# Patient Record
Sex: Male | Born: 1995 | Race: White | Hispanic: No | Smoking: Never smoker
Health system: Southern US, Community
[De-identification: ages and names within clinical notes are randomized; demographics above are authoritative.]

## PROBLEM LIST (undated history)

## (undated) DIAGNOSIS — F988 Other specified behavioral and emotional disorders with onset usually occurring in childhood and adolescence: Secondary | ICD-10-CM

## (undated) DIAGNOSIS — E669 Obesity, unspecified: Secondary | ICD-10-CM

## (undated) HISTORY — PX: NASAL POLYP SURGERY: SHX186

---

## 2021-03-28 ENCOUNTER — Ambulatory Visit
Admission: EM | Admit: 2021-03-28 | Discharge: 2021-03-28 | Disposition: A | Discharge status: Home / Self Care | Payer: BLUE CROSS/BLUE SHIELD | Attending: Emergency Medicine | Admitting: Emergency Medicine

## 2021-03-28 ENCOUNTER — Other Ambulatory Visit: Payer: Self-pay

## 2021-03-28 ENCOUNTER — Ambulatory Visit (INDEPENDENT_AMBULATORY_CARE_PROVIDER_SITE_OTHER): Payer: BLUE CROSS/BLUE SHIELD

## 2021-03-28 DIAGNOSIS — R079 Chest pain, unspecified: Secondary | ICD-10-CM

## 2021-03-28 DIAGNOSIS — R0789 Other chest pain: Secondary | ICD-10-CM | POA: Diagnosis present

## 2021-03-28 HISTORY — DX: Obesity, unspecified: E66.9

## 2021-03-28 HISTORY — DX: Other specified behavioral and emotional disorders with onset usually occurring in childhood and adolescence: F98.8

## 2021-03-28 LAB — CBC WITH DIFFERENTIAL/PLATELET
Abs Immature Granulocytes: 0.07 10*3/uL (ref 0.00–0.07)
Basophils Absolute: 0.1 10*3/uL (ref 0.0–0.1)
Basophils Relative: 1 %
Eosinophils Absolute: 0.2 10*3/uL (ref 0.0–0.5)
Eosinophils Relative: 2 %
HCT: 46.1 % (ref 39.0–52.0)
Hemoglobin: 15.3 g/dL (ref 13.0–17.0)
Immature Granulocytes: 1 %
Lymphocytes Relative: 22 %
Lymphs Abs: 2.3 10*3/uL (ref 0.7–4.0)
MCH: 27.7 pg (ref 26.0–34.0)
MCHC: 33.2 g/dL (ref 30.0–36.0)
MCV: 83.5 fL (ref 80.0–100.0)
Monocytes Absolute: 0.7 10*3/uL (ref 0.1–1.0)
Monocytes Relative: 7 %
Neutro Abs: 7.3 10*3/uL (ref 1.7–7.7)
Neutrophils Relative %: 67 %
Platelets: 318 10*3/uL (ref 150–400)
RBC: 5.52 MIL/uL (ref 4.22–5.81)
RDW: 13.9 % (ref 11.5–15.5)
WBC: 10.7 10*3/uL — ABNORMAL HIGH (ref 4.0–10.5)
nRBC: 0 % (ref 0.0–0.2)

## 2021-03-28 LAB — BASIC METABOLIC PANEL
Anion gap: 12 (ref 5–15)
BUN: 14 mg/dL (ref 6–20)
CO2: 24 mmol/L (ref 22–32)
Calcium: 9.4 mg/dL (ref 8.9–10.3)
Chloride: 98 mmol/L (ref 98–111)
Creatinine, Ser: 1.04 mg/dL (ref 0.61–1.24)
GFR, Estimated: >60 mL/min (ref >60)
Glucose, Bld: 95 mg/dL (ref 70–99)
Potassium: 3.8 mmol/L (ref 3.5–5.1)
Sodium: 134 mmol/L — ABNORMAL LOW (ref 135–145)

## 2021-03-28 LAB — TROPONIN I (HIGH SENSITIVITY): Troponin I (High Sensitivity): 6 ng/L (ref <18)

## 2021-03-28 MED ORDER — IBUPROFEN 600 MG PO TABS: 600.0000 mg | ORAL_TABLET | Freq: Four times a day (QID) | ORAL | 0 refills | Status: AC | PRN

## 2021-03-28 MED ORDER — IBUPROFEN 400 MG PO TABS
400.0000 mg | ORAL_TABLET | Freq: Once | ORAL | Status: AC
  Administered 2021-03-28: 400 mg via ORAL

## 2021-03-28 NOTE — ED Provider Notes (Signed)
[facility]    CSN: [id number] Arrival date & time: 03/28/21  0802      History   Chief Complaint No chief complaint on file.   HPI Jeffery Snow is a 26 y.o. male.   HPI  26 year old male here for evaluation of chest pain.  Patient reports that his chest pain started around 5 AM this morning.  He was awake at the time and it did not wake him from sleep.  It is located in the left side of his chest and does not radiate.  It is not associate with any shortness of breath, sweating, nausea, cough, dizziness, or syncope.  The pain does not increase with movement.  He is not a smoker does not have any previous history of pain.  He states that on exam he is also experienced some some pain in his left back along his scapular spine.  This is not present unless palpation is occurring.  The pain does not increase with movement.  He has had an increase in his physical activity over the past 2 weeks in the form of walking.  He does occasionally have heart pounding when he is exerting himself but not at rest.  He did take 1 single over-the-counter ibuprofen tablet at 0 700 this morning and it has not helped his pain.  He describes the pain more as a pulsing and it is worse when he lays down with him rating 4-5/10, improves with sitting and he rates it 2-3/10, and improves more with standing with a report of 1-2/10.  Patient has a history of ADHD and takes Adderall.  His only other history is nasal polyp surgery which occurred at a very young age.  He does not currently have a primary care provider.  Past Medical History:  Diagnosis Date   ADD (attention deficit disorder)    Obesity     There are no problems to display for this patient.   Past Surgical History:  Procedure Laterality Date   NASAL POLYP SURGERY         Home Medications    Prior to Admission medications   Medication Sig Start Date End Date Taking? Authorizing Provider  ibuprofen (ADVIL) 600 MG tablet Take 1  tablet (600 mg total) by mouth every 6 (six) hours as needed. 03/28/21  Yes Becky Augustayan, Jamilette Suchocki, NP  amphetamine-dextroamphetamine (ADDERALL) 15 MG tablet Take 1 tablet by mouth 2 (two) times daily. 03/15/21   [provider]    Family History History reviewed. No pertinent family history.  Social History Social History   Tobacco Use   Smoking status: Never    Passive exposure: Never   Smokeless tobacco: Never  Vaping Use   Vaping Use: Never used  Substance Use Topics   Alcohol use: Yes    Comment: occasional     Allergies   Cephalosporins and Penicillins   Review of Systems Review of Systems  Constitutional:  Negative for diaphoresis and fatigue.  Respiratory:  Negative for cough and shortness of breath.   Cardiovascular:  Positive for chest pain. Negative for palpitations and leg swelling.  Gastrointestinal:  Negative for nausea.  Musculoskeletal:  Positive for myalgias.  Skin:  Negative for rash.  Neurological:  Negative for dizziness, syncope and weakness.  Hematological: Negative.   Psychiatric/Behavioral: Negative.      Physical Exam Triage Vital Signs ED Triage Vitals [03/28/21 0817]  Enc Vitals Group     BP      Pulse  Resp      Temp      Temp src      SpO2      Weight (!) 370 lb (167.8 kg)     Height 6\' 3"  (1.905 m)     Head Circumference      Peak Flow      Pain Score 0     Pain Loc      Pain Edu?      Excl. in [place]?    No data found.  Updated Vital Signs BP 132/85 (BP Location: Left Arm)    Pulse 88    Temp 98.4 F (36.9 C) (Oral)    Resp 19    Ht 6\' 3"  (1.905 m)    Wt (!) 370 lb (167.8 kg)    SpO2 100%    BMI 46.25 kg/m   Visual Acuity Right Eye Distance:   Left Eye Distance:   Bilateral Distance:    Right Eye Near:   Left Eye Near:    Bilateral Near:     Physical Exam Vitals and nursing note reviewed.  Constitutional:      General: He is not in acute distress.    Appearance: Normal appearance. He is obese. He is not  ill-appearing.  HENT:     Head: Normocephalic and atraumatic.  Eyes:     Pupils: Pupils are equal, round, and reactive to light.  Neck:     Vascular: No carotid bruit.  Cardiovascular:     Rate and Rhythm: Normal rate and regular rhythm.     Pulses: Normal pulses.     Heart sounds: Normal heart sounds. No murmur heard.   No friction rub. No gallop.  Pulmonary:     Effort: Pulmonary effort is normal.     Breath sounds: Normal breath sounds. No wheezing, rhonchi or rales.  Chest:     Chest wall: No tenderness.  Musculoskeletal:     Cervical back: Normal range of motion and neck supple.  Skin:    General: Skin is warm and dry.     Capillary Refill: Capillary refill takes less than 2 seconds.     Findings: No erythema or rash.  Neurological:     General: No focal deficit present.     Mental Status: He is alert and oriented to person, place, and time.  Psychiatric:        Mood and Affect: Mood normal.        Behavior: Behavior normal.        Thought Content: Thought content normal.        Judgment: Judgment normal.     UC Treatments / Results  Labs (all labs ordered are listed, but only abnormal results are displayed) Labs Reviewed  CBC WITH DIFFERENTIAL/PLATELET - Abnormal; Notable for the following components:      Result Value   WBC 10.7 (*)    All other components within normal limits  BASIC METABOLIC PANEL - Abnormal; Notable for the following components:   Sodium 134 (*)    All other components within normal limits  TROPONIN I (HIGH SENSITIVITY)    EKG Sinus rhythm with sinus arrhythmia and a ventricular rate of 81 bpm PR interval 174 ms QRS duration 98 ms QT/QTc 370/429 ms Patient does have questionable flattening of the ST segment in V1 possibly V6.  There is no other abnormalities noted in any of the other leads.  No other EKGs for comparison. Machine interpretation is possible left atrial enlargement, left ventricular  hypertrophy, abnormal  EKG.   Radiology DG Chest 2 View  Result Date: 03/28/2021 CLINICAL DATA:  Chest pain. EXAM: CHEST - 2 VIEW COMPARISON:  None. FINDINGS: Trachea is midline. Heart size normal. Lungs are clear. No pleural fluid. IMPRESSION: No acute findings. Electronically Signed   By: Lorin Picket M.D.   On: 03/28/2021 08:58    Procedures Procedures (including critical care time)  Medications Ordered in UC Medications  ibuprofen (ADVIL) tablet 400 mg (400 mg Oral Given 03/28/21 0856)    Initial Impression / Assessment and Plan / UC Course  I have reviewed the triage vital signs and the nursing notes.  Pertinent labs & imaging results that were available during my care of the patient were reviewed by me and considered in my medical decision making (see chart for details).  Patient is a nontoxic-appearing 26 year old male with past medical history of ADHD on Adderall 15 mg IR daily and nasal polyp surgery at a young age.  No other significant past medical history and patient does not have a PCP.  He is obese and weighs 370 pounds with a BMI of 46.25.  He is mildly hypertensive at 132/85.  He is presenting with a complaint of left-sided chest pain that he describes as a pulsating that is worse when he lays down and improves with sitting and standing.  He has never had this before.  He is not a smoker.  There is no associated cough, shortness of breath, sweating, nausea, dizziness, or syncope.  The pain does not increase with movement or deep breathing.  On physical exam patient is in a relaxed state with a normal axial carriage.  He is able to carry on a conversation without any dyspnea or tachypnea.  On physical exam his heart sounds are S1-S2 without any murmur, rub, or gallop.  Lung sounds are clear to auscultation all fields.  No bruits appreciated when auscultating the carotid arteries on either side.  Etiology of patient's discomfort is unclear.  It is less likely that it is pericarditis even though it  improves with sitting up as the pain is not sharp and pleuritic.  There is no friction rub to auscultation and EKG does not show widespread ST elevation and PR depression.  Will check CBC, BMP, troponin, and chest x-ray.  EKG was collected at triage and does show sinus arrhythmia.  There are no other significant abnormalities noted.  Patient has no other EKG tracings in the system for comparison.  CBC has a very mildly elevated white count of 10.7 but is otherwise unremarkable.  Chest x-ray independently reviewed and evaluated by me.  Impression: Right vasculature is prominent.  There is no effusion or infiltrate noted.  Ideology overread is pending. Radiology impression states trachea is midline, heart size is normal, lungs are clear, no pleural fluid, no acute findings.  BMP shows a mildly decreased sodium of 134 but is otherwise unremarkable.  Troponin is 6.  Given the fact that patient has some tenderness to his back along his left scapular spine with palpation gives credence that this may be musculoskeletal in nature.  I Minna discharge him home with atypical chest pain as his diagnosis and make referral to cardiology given his sinus arrhythmia.  He does endorse that the higher dose of ibuprofen seems to be helping his discomfort some.  I advised him that if his pain continues, or worsens, is associated with sweating, nausea, dizziness, fainting, or radiation to his shoulder or jaw that he needs to  go to the ER for evaluation.     Final Clinical Impressions(s) / UC Diagnoses   Final diagnoses:  Atypical chest pain     Discharge Instructions      As we discussed, your chest pain may be musculoskeletal in nature even though it does not seem to increase with movement.  You do have some tenderness along the left scapular spine to palpation without any overt muscle spasm or tension appreciated on exam.  As you expressed some improvement of your symptoms with an appropriate dose of  ibuprofen I am can have you continue 600 mg of ibuprofen every 6 hours with food as needed for pain.  I am going to refer you to cardiology given that you have this atypical presentation and you have sinus arrhythmia on your EKG.  This is most likely benign and could be a normal variant as we discussed.  If you have continued or worsening chest pain, especially if it is associated with sweating, nausea, shortness of breath, dizziness, fainting, or radiation to your jaw or left shoulder you need to go to the ER for evaluation.     ED Prescriptions     Medication Sig Dispense Auth. Provider   ibuprofen (ADVIL) 600 MG tablet Take 1 tablet (600 mg total) by mouth every 6 (six) hours as needed. 30 tablet Margarette Canada, NP      PDMP not reviewed this encounter.   Margarette Canada, NP 03/28/21 1039

## 2021-03-28 NOTE — Discharge Instructions (Addendum)
As we discussed, your chest pain may be musculoskeletal in nature even though it does not seem to increase with movement.  You do have some tenderness along the left scapular spine to palpation without any overt muscle spasm or tension appreciated on exam.  As you expressed some improvement of your symptoms with an appropriate dose of ibuprofen I am can have you continue 600 mg of ibuprofen every 6 hours with food as needed for pain.  I am going to refer you to cardiology given that you have this atypical presentation and you have sinus arrhythmia on your EKG.  This is most likely benign and could be a normal variant as we discussed.  If you have continued or worsening chest pain, especially if it is associated with sweating, nausea, shortness of breath, dizziness, fainting, or radiation to your jaw or left shoulder you need to go to the ER for evaluation.

## 2021-03-28 NOTE — ED Triage Notes (Addendum)
Pt reports left chest pain starting at 0500.  Worse when lying down, better when sitting, and gone when standing. Denies pain in triage but states "it's just uncomfortable".  Non-radiating and no associated sx. Pt reports hurts to touch over left scapula area

## 2021-06-20 ENCOUNTER — Ambulatory Visit
Admission: RE | Admit: 2021-06-20 | Discharge: 2021-06-20 | Disposition: A | Discharge status: Home / Self Care | Payer: BLUE CROSS/BLUE SHIELD | Source: Ambulatory Visit | Attending: Emergency Medicine | Admitting: Emergency Medicine

## 2021-06-20 VITALS — BP 126/70 | HR 90 | Temp 98.4°F | Resp 18 | Ht 75.0 in | Wt 369.9 lb

## 2021-06-20 DIAGNOSIS — L0231 Cutaneous abscess of buttock: Secondary | ICD-10-CM

## 2021-06-20 MED ORDER — DOXYCYCLINE HYCLATE 100 MG PO CAPS: 100.0000 mg | ORAL_CAPSULE | Freq: Two times a day (BID) | ORAL | 0 refills | Status: DC

## 2021-06-20 NOTE — ED Provider Notes (Signed)
?[place] ? ? ? ?CSN: [id number] ?Arrival date & time: 06/20/21  [id number] ? ? ?  ? ?History   ?Chief Complaint ?Chief Complaint  ?Patient presents with  ? Abscess  ?  Possible abscess on buttock. - Entered by patient  ? 0900 Appt;   ? ? ?HPI ?Jeffery Snow is a 26 y.o. male.  ? ?HPI ? ?26 year old male here for evaluation of skin complaint. ? ?Patient reports that for over a year he has been experiencing mild pain and swelling of his left buttock near his rectal opening.  He states that several months ago the area ruptured and expressed some sanguinous discharge.  Since then he has had mild bleeding and drainage from the area daily.  He states it is not growing in size, he denies any fever, and he denies any pain with bowel movements.  He states that initially he thought it might be hemorrhoid and was treating with Preparation H, which did improve the symptoms, but did not provide resolution. ? ?Past Medical History:  ?Diagnosis Date  ? ADD (attention deficit disorder)   ? Obesity   ? ? ?There are no problems to display for this patient. ? ? ?Past Surgical History:  ?Procedure Laterality Date  ? NASAL POLYP SURGERY    ? ? ? ? ? ?Home Medications   ? ?Prior to Admission medications   ?Medication Sig Start Date End Date Taking? Authorizing Provider  ?doxycycline (VIBRAMYCIN) 100 MG capsule Take 1 capsule (100 mg total) by mouth 2 (two) times daily. 06/20/21  Yes Margarette Canada, NP  ?amphetamine-dextroamphetamine (ADDERALL) 15 MG tablet Take 1 tablet by mouth 2 (two) times daily. 03/15/21   [provider]  ?ibuprofen (ADVIL) 600 MG tablet Take 1 tablet (600 mg total) by mouth every 6 (six) hours as needed. 03/28/21   Margarette Canada, NP  ? ? ?Family History ?History reviewed. No pertinent family history. ? ?Social History ?Social History  ? ?Tobacco Use  ? Smoking status: Never  ?  Passive exposure: Never  ? Smokeless tobacco: Never  ?Vaping Use  ? Vaping Use: Never used  ?Substance Use Topics  ? Alcohol use:  Yes  ?  Comment: occasional  ? ? ? ?Allergies   ?Cephalosporins and Penicillins ? ? ?Review of Systems ?Review of Systems  ?Constitutional:  Negative for fever.  ?Gastrointestinal:  Negative for abdominal pain and rectal pain.  ?Skin:  Positive for wound.  ?Hematological: Negative.   ?Psychiatric/Behavioral: Negative.    ? ? ?Physical Exam ?Triage Vital Signs ?ED Triage Vitals  ?Enc Vitals Group  ?   BP 06/20/21 0917 126/70  ?   Pulse Rate 06/20/21 0917 90  ?   Resp 06/20/21 0917 18  ?   Temp 06/20/21 0917 98.4 ?F (36.9 ?C)  ?   Temp src --   ?   SpO2 06/20/21 0917 99 %  ?   Weight 06/20/21 0916 (!) 369 lb 14.9 oz (167.8 kg)  ?   Height 06/20/21 0916 6\' 3"  (1.905 m)  ?   Head Circumference --   ?   Peak Flow --   ?   Pain Score 06/20/21 0916 0  ?   Pain Loc --   ?   Pain Edu? --   ?   Excl. in [place]? --   ? ?No data found. ? ?Updated Vital Signs ?BP 126/70 (BP Location: Left Arm)   Pulse 90   Temp 98.4 ?F (36.9 ?C)   Resp 18  Ht 6\' 3"  (1.905 m)   Wt (!) 369 lb 14.9 oz (167.8 kg)   SpO2 99%   BMI 46.24 kg/m?  ? ?Visual Acuity ?Right Eye Distance:   ?Left Eye Distance:   ?Bilateral Distance:   ? ?Right Eye Near:   ?Left Eye Near:    ?Bilateral Near:    ? ?Physical Exam ?Vitals and nursing note reviewed.  ?Constitutional:   ?   Appearance: Normal appearance. He is not ill-appearing.  ?Genitourinary: ?   Comments: There is an open area on the inferior aspect of the medial left buttock near the rectal opening that appears to be scabbed over.  There is some mild erythema and induration around the area.  The rectal opening appears normal and there is no evidence of hemorrhoids.  The area is not tender to touch and does not express any discharge with squeezing. ?Skin: ?   General: Skin is warm and dry.  ?   Capillary Refill: Capillary refill takes less than 2 seconds.  ?   Findings: Erythema present.  ?Neurological:  ?   General: No focal deficit present.  ?   Mental Status: He is alert and oriented to person, place,  and time.  ?Psychiatric:     ?   Mood and Affect: Mood normal.     ?   Behavior: Behavior normal.     ?   Thought Content: Thought content normal.     ?   Judgment: Judgment normal.  ? ? ? ?UC Treatments / Results  ?Labs ?(all labs ordered are listed, but only abnormal results are displayed) ?Labs Reviewed - No data to display ? ?EKG ? ? ?Radiology ?No results found. ? ?Procedures ?Procedures (including critical care time) ? ?Medications Ordered in UC ?Medications - No data to display ? ?Initial Impression / Assessment and Plan / UC Course  ?I have reviewed the triage vital signs and the nursing notes. ? ?Pertinent labs & imaging results that were available during my care of the patient were reviewed by me and considered in my medical decision making (see chart for details). ? ?The patient is a nontoxic-appearing 26 year old male here for evaluation of a possible abscess on his left buttock that has been present for over a year and that ruptured several months ago causing the expulsion of a sanguinous discharge.  Since that time he has continued to have some sanguinous drainage on a daily basis that is in the scant amount.  On exam the patient has an opening on the inferior medial left buttock to the rectal opening that is approximately size of a #2 pencil lead.  The area is scabbed over but there is some mild erythema and induration of the surrounding tissue.  The rectal opening is normal in appearance.  There is no fluctuance noted when palpating along the remainder of the buttock and perirectal area.  I suspect patient had an abscess which ruptured on its own.  Given the residual induration and redness I will place the patient on doxycycline to see if full resolution can be achieved.  If full resolution is not achieved the patient will need to follow-up with general surgery.  I have also advised the patient that if the redness returns, pain returns, swelling returns, or if he starts running a fever that he should  follow-up in the emergency department.  Patient denies need for work note. ? ? ?Final Clinical Impressions(s) / UC Diagnoses  ? ?Final diagnoses:  ?Abscess of buttock, left  ? ? ? ?  Discharge Instructions   ? ?  ?Take the Doxycycline twice daily with food for 10 days. ? ?Doxycycline will make you more sensitive to sunburn so wear sunscreen when outdoors and reapply it every 90 minutes. ? ?Apply warm compresses to help promote drainage. ? ?Use OTC Tylenol and Ibuprofen according to the package instructions as needed for pain. ? ?If your symptoms do not improve you will need to go to the ER or see general surgery.   ? ? ? ? ?ED Prescriptions   ? ? Medication Sig Dispense Auth. Provider  ? doxycycline (VIBRAMYCIN) 100 MG capsule Take 1 capsule (100 mg total) by mouth 2 (two) times daily. 20 capsule Margarette Canada, NP  ? ?  ? ?PDMP not reviewed this encounter. ?  ?Margarette Canada, NP ?06/20/21 1017 ? ?

## 2021-06-20 NOTE — Discharge Instructions (Signed)
Take the Doxycycline twice daily with food for 10 days. ? ?Doxycycline will make you more sensitive to sunburn so wear sunscreen when outdoors and reapply it every 90 minutes. ? ?Apply warm compresses to help promote drainage. ? ?Use OTC Tylenol and Ibuprofen according to the package instructions as needed for pain. ? ?If your symptoms do not improve you will need to go to the ER or see general surgery.   ?

## 2021-06-20 NOTE — ED Triage Notes (Signed)
Pt reports a possible abscess on buttocks that has been present for at least a year. States he believed it was a hemorrhoid at first now believe it could be an abscess.  ?

## 2021-07-19 ENCOUNTER — Other Ambulatory Visit: Payer: Self-pay

## 2021-07-19 ENCOUNTER — Ambulatory Visit
Admission: RE | Admit: 2021-07-19 | Discharge: 2021-07-19 | Disposition: A | Discharge status: Home / Self Care | Payer: BLUE CROSS/BLUE SHIELD | Source: Ambulatory Visit | Attending: Emergency Medicine | Admitting: Emergency Medicine

## 2021-07-19 VITALS — BP 121/84 | HR 83 | Temp 98.3°F | Resp 20

## 2021-07-19 DIAGNOSIS — L0231 Cutaneous abscess of buttock: Secondary | ICD-10-CM | POA: Diagnosis not present

## 2021-07-19 MED ORDER — DOXYCYCLINE HYCLATE 100 MG PO CAPS: 100.0000 mg | ORAL_CAPSULE | Freq: Two times a day (BID) | ORAL | 0 refills | Status: AC

## 2021-07-19 NOTE — ED Triage Notes (Signed)
Patient reports an abscess near rectum.  Patient was seen 06/20/2021 and placed on antibiotics.  Reports this cleared up most of the issue, but not completely healed.

## 2021-07-19 NOTE — ED Provider Notes (Signed)
[facility]    CSN: [id number] Arrival date & time: 07/19/21  1000      History   Chief Complaint Chief Complaint  Patient presents with   Abscess    Entered by patient    HPI Jeffery Snow is a 26 y.o. male.   HPI  26 year old male here for reevaluation of left buttock abscess.  Patient was evaluated in this urgent care on 06/20/2021 for an abscess on his left buttock.  At that time the abscess had ruptured and it was draining on its own.  He was prescribed a 10-day course of doxycycline and discharged home.  He reports that the wound drained a significant amount of drainage while he was on the antibiotics.  He is now concerned because there is a small, hard area with a scab center and he is not sure that the entire infection has resolved.  He states that it is not draining anymore.  He is not having any pain.  He also denies fever.  Past Medical History:  Diagnosis Date   ADD (attention deficit disorder)    Obesity     There are no problems to display for this patient.   Past Surgical History:  Procedure Laterality Date   NASAL POLYP SURGERY         Home Medications    Prior to Admission medications   Medication Sig Start Date End Date Taking? Authorizing Provider  amphetamine-dextroamphetamine (ADDERALL) 15 MG tablet Take 1 tablet by mouth 2 (two) times daily. 03/15/21   [provider]  doxycycline (VIBRAMYCIN) 100 MG capsule Take 1 capsule (100 mg total) by mouth 2 (two) times daily for 5 days. 07/19/21 07/24/21  Becky Augusta, NP  ibuprofen (ADVIL) 600 MG tablet Take 1 tablet (600 mg total) by mouth every 6 (six) hours as needed. 03/28/21   Becky Augusta, NP    Family History History reviewed. No pertinent family history.  Social History Social History   Tobacco Use   Smoking status: Never    Passive exposure: Never   Smokeless tobacco: Never  Vaping Use   Vaping Use: Never used  Substance Use Topics   Alcohol use: Yes    Comment:  occasional   Drug use: Never     Allergies   Cephalosporins and Penicillins   Review of Systems Review of Systems  Constitutional:  Negative for fever.  Skin:  Negative for color change.       Hard area on inferior medial aspect of left buttock.     Physical Exam Triage Vital Signs ED Triage Vitals  Enc Vitals Group     BP 07/19/21 1041 121/84     Pulse Rate 07/19/21 1041 83     Resp 07/19/21 1041 20     Temp 07/19/21 1041 98.3 F (36.8 C)     Temp Source 07/19/21 1041 Oral     SpO2 07/19/21 1041 100 %     Weight --      Height --      Head Circumference --      Peak Flow --      Pain Score 07/19/21 1039 0     Pain Loc --      Pain Edu? --      Excl. in GC? --    No data found.  Updated Vital Signs BP 121/84 (BP Location: Left Arm) Comment (BP Location): large cuff  Pulse 83   Temp 98.3 F (36.8 C) (Oral)   Resp 20  SpO2 100%   Visual Acuity Right Eye Distance:   Left Eye Distance:   Bilateral Distance:    Right Eye Near:   Left Eye Near:    Bilateral Near:     Physical Exam Vitals and nursing note reviewed.  Constitutional:      Appearance: Normal appearance. He is not ill-appearing.  HENT:     Head: Normocephalic and atraumatic.  Skin:    General: Skin is warm and dry.     Capillary Refill: Capillary refill takes less than 2 seconds.     Findings: No erythema, lesion or rash.  Neurological:     General: No focal deficit present.     Mental Status: He is alert and oriented to person, place, and time.  Psychiatric:        Mood and Affect: Mood normal.        Behavior: Behavior normal.        Thought Content: Thought content normal.        Judgment: Judgment normal.      UC Treatments / Results  Labs (all labs ordered are listed, but only abnormal results are displayed) Labs Reviewed - No data to display  EKG   Radiology No results found.  Procedures Procedures (including critical care time)  Medications Ordered in  UC Medications - No data to display  Initial Impression / Assessment and Plan / UC Course  I have reviewed the triage vital signs and the nursing notes.  Pertinent labs & imaging results that were available during my care of the patient were reviewed by me and considered in my medical decision making (see chart for details).  26 year old male here for reevaluation of an abscess on his left buttock.  On exam patient has a small, nickel sized area that is hard under the skin in the area where his previous abscess was.  There is a scab center to the lesion.  There is no surrounding erythema, warmth, or tenderness.  I suspect that this may be some granulomatous tissue as result of the patient's abscess that he had.  I will place him prophylactically on a 5-day course of doxycycline just to ensure that the infection has resolved.  Advised the patient that this may or may not resolve on its own.  It could in fact be scar tissue that will always be present.  I have advised the patient that if he has return of redness, swelling, tenderness, or fever he should return for reevaluation.  He denies need for work note.   Final Clinical Impressions(s) / UC Diagnoses   Final diagnoses:  Abscess of buttock, left     Discharge Instructions      The area of your previous abscess is well healed and there is no additional redness.   The hard area you are feeling may be granulomatous scar tissue from the healed tract.  As a precaution I am going to prescribe a short, 5 day course of Doxycyline to take twice daily with food.  This area may resolve on its own with time.  If you have a return of redness, swelling, or pain return for re-evaluation.     ED Prescriptions     Medication Sig Dispense Auth. Provider   doxycycline (VIBRAMYCIN) 100 MG capsule Take 1 capsule (100 mg total) by mouth 2 (two) times daily for 5 days. 10 capsule Becky Augusta, NP      PDMP not reviewed this encounter.   Becky Augusta, NP 07/19/21 1131

## 2021-07-19 NOTE — Discharge Instructions (Signed)
The area of your previous abscess is well healed and there is no additional redness.   The hard area you are feeling may be granulomatous scar tissue from the healed tract.  As a precaution I am going to prescribe a short, 5 day course of Doxycyline to take twice daily with food.  This area may resolve on its own with time.  If you have a return of redness, swelling, or pain return for re-evaluation.

## 2023-02-22 IMAGING — CR DG CHEST 2V
3 series · 3 of 3 positions shown · non-contrast
Comparison: None.

CLINICAL DATA: Chest pain.

EXAM:
CHEST - 2 VIEW

[chest pa]
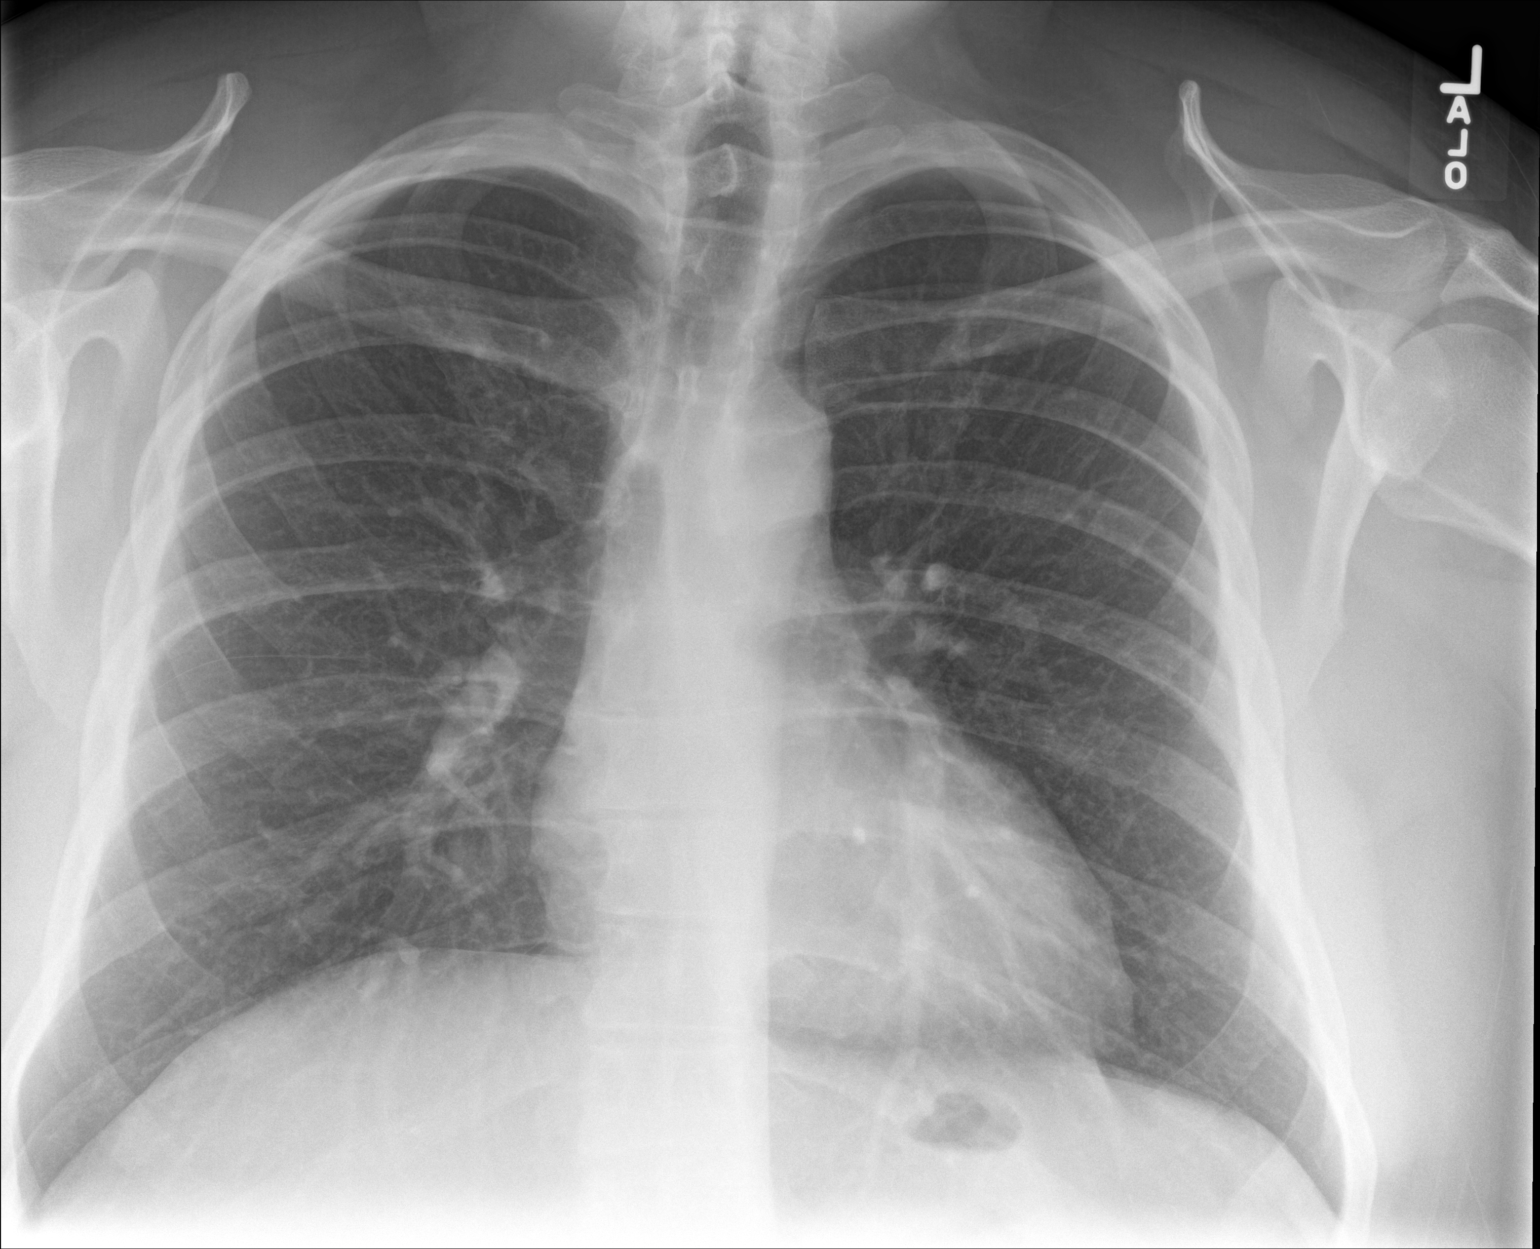

[chest lat (1 of 2)]
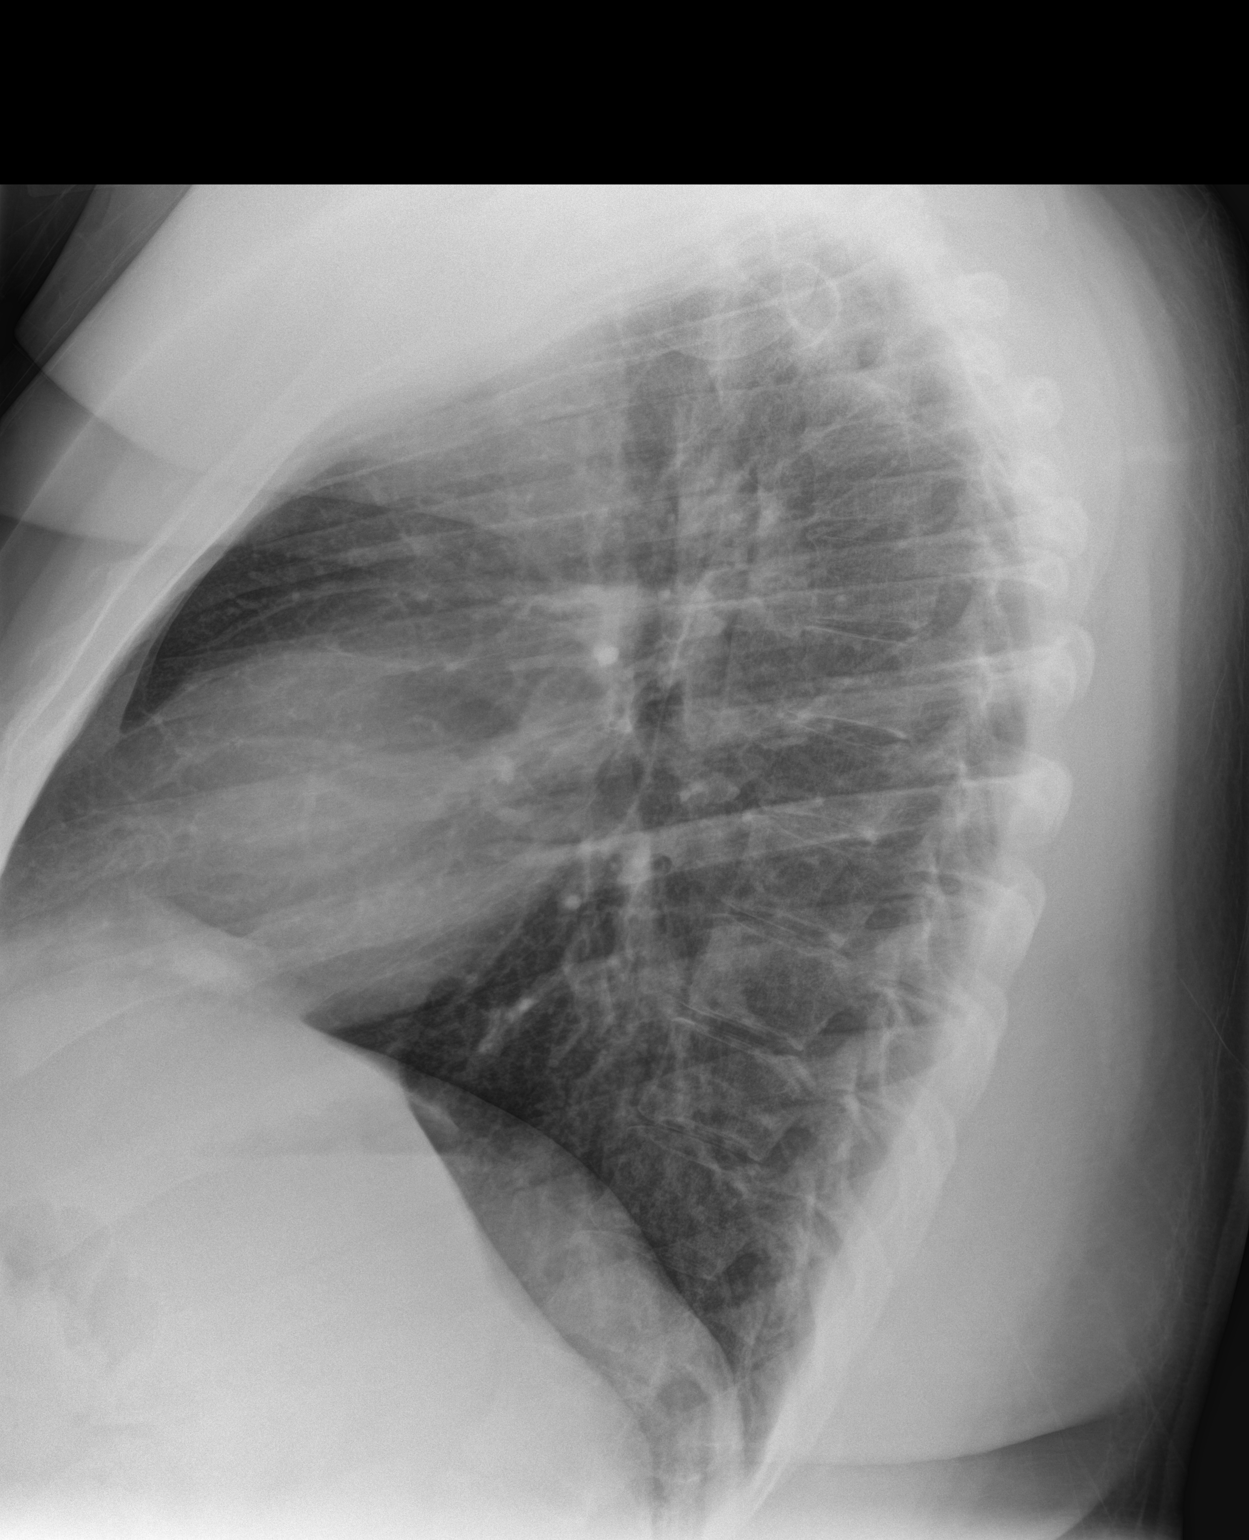

[chest lat (2 of 2)]
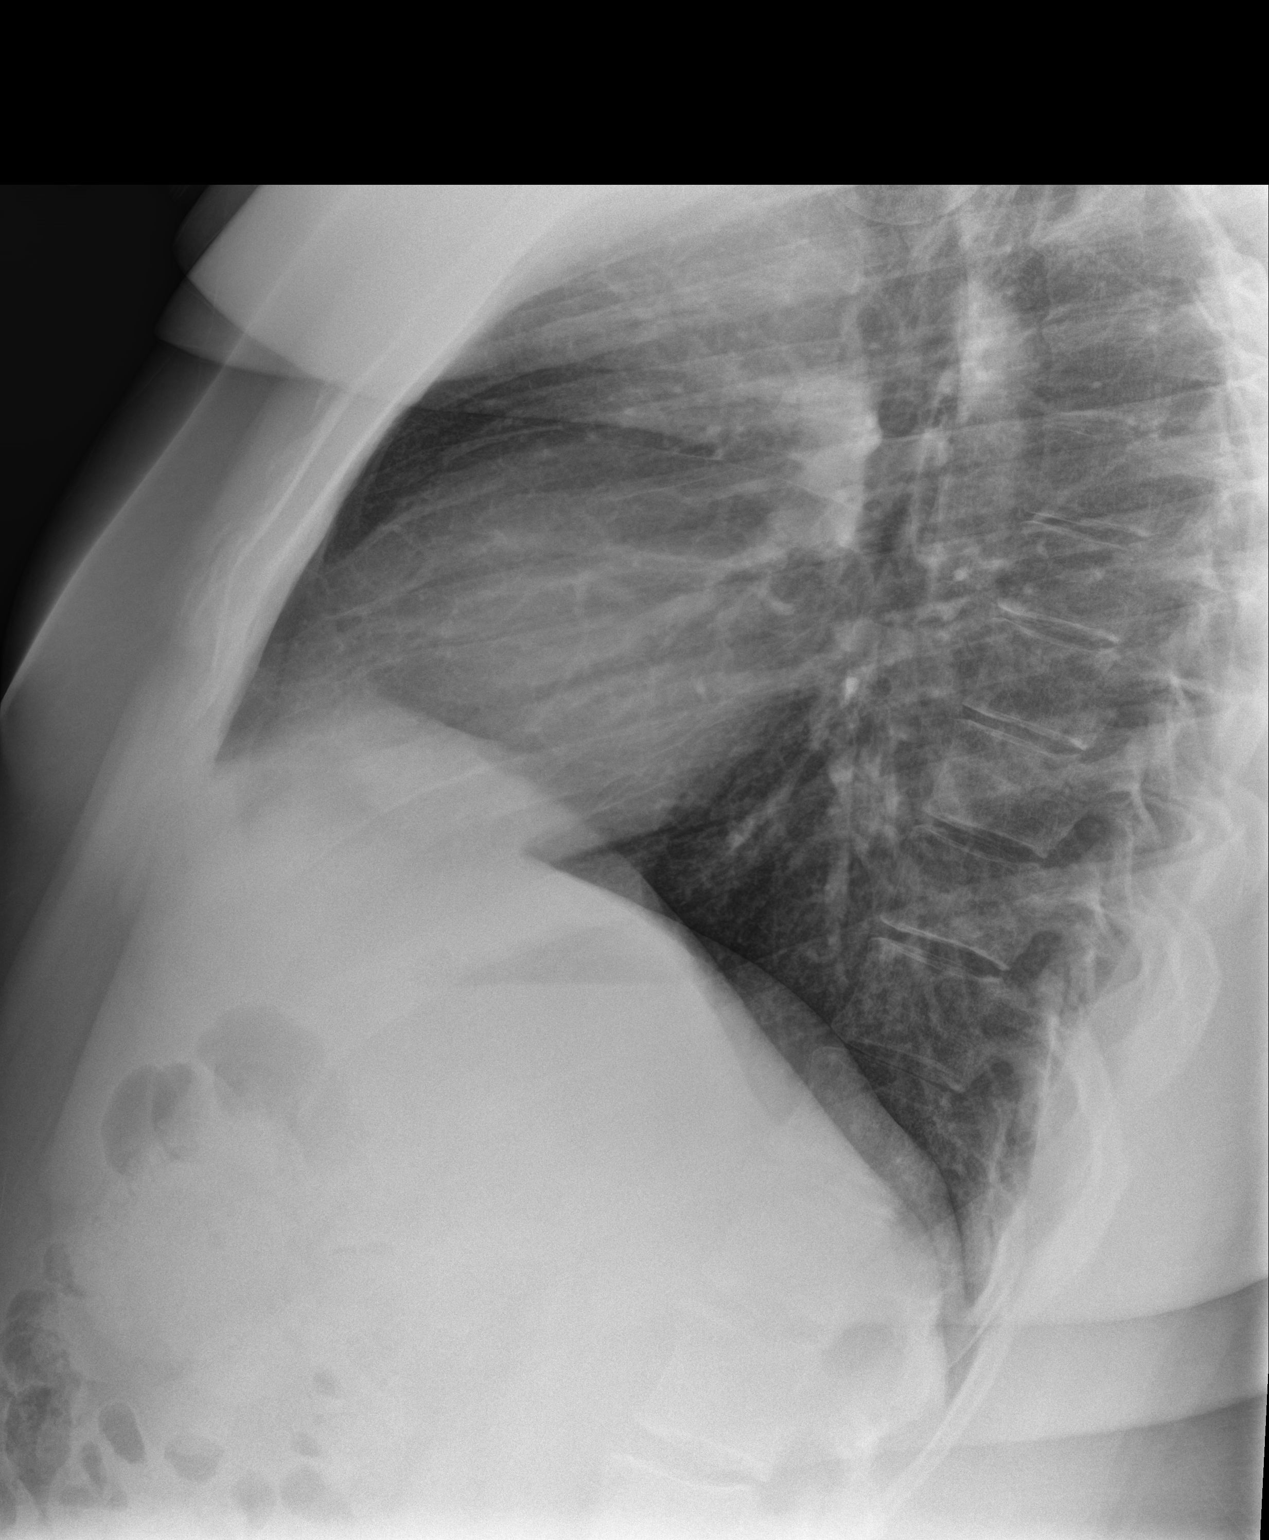

[3 of 3 positions shown; findings below may reference images not displayed]

FINDINGS: Trachea is midline. Heart size normal. Lungs are clear. No pleural
fluid.
IMPRESSION: No acute findings.

## 2024-02-18 ENCOUNTER — Ambulatory Visit: Admitting: Urology

## 2024-02-18 VITALS — BP 137/88 | HR 87 | Ht 75.0 in | Wt 320.0 lb

## 2024-02-18 DIAGNOSIS — N472 Paraphimosis: Secondary | ICD-10-CM

## 2024-02-18 NOTE — Patient Instructions (Signed)
 Paraphimosis Paraphimosis is a serious condition that happens when the fold of skin that stretches over the tip of the penis (foreskin) becomes stuck when it is pulled back. This condition blocks fluid from flowing away from the penis tip, and that causes swelling that gets worse and worse. Paraphimosis needs to be treated right away. What are the causes? This condition may be caused by: Leaving the foreskin pulled back after a procedure, such as after the placement of a catheter. A foreskin that is tighter than normal. A foreskin that has been pulled back for too long. A forceful pulling back of the foreskin. Infection under the foreskin. Trauma to the area, such as a hard hit to the tip of the penis. Having sex or masturbating. Hair or clothing that gets wrapped around the penis. What increases the risk? You are more likely to develop this condition if: You have not had all of your foreskin removed. You have had frequent procedures or surgeries on your urinary or reproductive organs. You have poor hygiene. You need help with daily hygiene from a caregiver. What are the signs or symptoms? Symptoms of this condition include: A foreskin that cannot be returned to its normal position. Pain, often at the tip of the penis. Swelling of the penis. Trouble urinating. Skin that is red or bluish at the tip of the penis. How is this diagnosed? This condition may be diagnosed based on your symptoms and a physical exam. How is this treated? This condition may be treated with: A procedure in which your health care provider manually moves the foreskin back into position over the tip of the penis. Swelling may first be reduced by: Applying ice to the area. Wrapping a bandage tightly around the penis. Using needles to drain any fluid, pus, or blood that is causing the swelling. Applying a gauze soaked with medicines or solutions. Applying granulated sugar to the area. Injecting medicine into the  foreskin to reduce swelling. Medicine to relieve pain. Medicine may be given by mouth, through an IV, or by an injection to the base of the penis (nerve block). A procedure in which a small incision is made in the tightened foreskin to free it and allow it to be pulled back into place (dorsal slit procedure). Surgery to remove the foreskin (circumcision). This may be done if the foreskin cannot be moved back into place. Most of the time, treatment can be done in a clinic or a health care provider's office. Follow these instructions at home: Lifestyle Follow instructions from your health care provider about avoiding sexual activity. Wear loose undergarments to avoid applying pressure to the area. General instructions Take over-the-counter and prescription medicines only as told by your health care provider. Apply any creams to the affected area only as told by your health care provider. Follow instructions from your health care provider about how to take care of your wound. Make sure you: Wash your hands with soap and water for at least 20 seconds before and after you change your bandage (dressing) if you were given one. If soap and water are not available, use hand sanitizer. Ask when you should remove your dressing. Ask whether the area can get wet. Do not retract the foreskin for 1 week or as told by your health care provider. Keep all follow-up visits. This is important. Contact a health care provider if: The treated area of skin does not heal or it becomes more irritated, red, or bloody. Urination is difficult or painful. Pain  in the penis continues, even after you take medicine for pain. You develop a fever. Get help right away if: The skin at the tip of the penis is red or bluish. You have severe pain. Summary Paraphimosis is a serious condition that happens when the fold of skin that stretches over the tip of the penis (foreskin) becomes stuck when it is pulled back. Paraphimosis  needs to be treated right away. Take over-the-counter and prescription medicines only as told by your health care provider. Apply any creams to the affected area only as told by your health care provider. Contact a health care provider if urination is difficult or painful, or if pain in the penis continues even after you take medicine for pain. Keep all follow-up visits. This is important. This information is not intended to replace advice given to you by your health care provider. Make sure you discuss any questions you have with your health care provider. ------------------------------------------------------------------------  Circumcision Information  Male infants are born with a fold of skin that covers the head of the penis (foreskin). This fold of skin is often removed shortly after birth with a surgery that is called circumcision. A circumcision may be done by a health care provider who is involved in newborn care or by a specialist who cares for the urinary tract (urologist). Circumcisions may also be done in non-medical settings for religious or cultural reasons. Who should be circumcised? The decision to leave the foreskin on or to have it removed is a personal one. It is often based on religious, social, or cultural beliefs. Circumcision is most often done in the first few days of life, but it may also be done later in life. In general: All healthy boys with a normal penis formation can be circumcised in the first few days after birth. Boys who are born early (prematurely) or who are ill should not be circumcised until they are older and stronger. Boys with certain deformities of the penis or deformities of the opening of the penis (urethra) should not be circumcised. How is circumcision done? The penis and the area around it are cleansed well. An injection may be given to numb the area. A numbing cream may be applied to the area. A special clamp or ring is attached to the penis and  used to remove the foreskin. The area is then cleansed well again. Medicine and gauze are applied to it. What are the benefits of circumcision? When the foreskin is removed: The head of the penis is easier to wash. This lowers the risk for odors, swelling, and infection. Some men are less likely to: Carry the virus that causes genital warts (human papillomavirus or HPV). Contract human immunodeficiency virus (HIV). Develop cancer of the penis. Get urinary infections. Develop inflammation of the penis. What are the risks of circumcision? Circumcision is a safe procedure. However, problems may occur, including: Infection. Bleeding. Removal of too much or too little foreskin. This affects the appearance of the penis. Irritation and narrowing of the urinary opening. This is usually short term. Scarring of the penis. This may affect the way the penis functions. Where to find more information [employer] ([employer]), Newborn Male Circumcision: acog.org Summary Male infants are born with a fold of skin that covers the head of the penis. This fold of skin is often removed shortly after birth with a surgery that is called circumcision. When the foreskin is removed, the head of the penis is easier to wash  and keep clean. Some men who are circumcised are less likely to carry viruses, get urinary infections, or develop cancer of the penis. Circumcision is a safe procedure. However, problems may occur, including infection, bleeding, scarring, and irritation and narrowing of the opening of the penis. This information is not intended to replace advice given to you by your health care provider. Make sure you discuss any questions you have with your health care provider. Document Revised: 01/29/2021 Document Reviewed: 01/29/2021 Elsevier Patient Education  2025 [employer].

## 2024-02-18 NOTE — Progress Notes (Signed)
" ° °  02/18/2024 8:55 AM   Jeffery Snow 1995-12-17 [id number]  CC: Paraphimosis  HPI: 29 year old male with morbid obesity BMI 40, uncircumcised, recently presented to [facility] on 02/12/2024 with paraphimosis and was reduced at bedside by urology.  This occurred after sexual activity, was one of his first times being sexually active, and sounds like he did not know he had to reduce the foreskin afterwards.  He has some baseline phimosis and does not typically see the glans of the penis.  Denies any complaints today   PMH: Past Medical History:  Diagnosis Date   ADD (attention deficit disorder)    Obesity     Surgical History: Past Surgical History:  Procedure Laterality Date   NASAL POLYP SURGERY       Family History: No family history on file.  Social History:  reports that he has never smoked. He has never been exposed to tobacco smoke. He has never used smokeless tobacco. He reports current alcohol use. He reports that he does not use drugs.  Physical Exam: BP 137/88 (BP Location: Left Arm, Patient Position: Sitting, Cuff Size: Large)   Pulse 87   Ht 6' 3 (1.905 m)   Wt (!) 320 lb (145.2 kg)   SpO2 100%   BMI 40.00 kg/m    Constitutional:  Alert and oriented, No acute distress. Cardiovascular: No clubbing, cyanosis, or edema. Respiratory: Normal respiratory effort, no increased work of breathing. GI: Abdomen is soft, nontender, nondistended, no abdominal masses GU: Uncircumcised phallus, phimosis, unable to completely reduce foreskin to visualize the glans  Assessment & Plan:   29 year old uncircumcised male with morbid obesity and BMI of 40, recent episode of paraphimosis after one of his first episodes of sexual activity.  We had a long conversation about importance of reducing the foreskin after sexual activity, and that he does have some baseline phimosis.  We discussed options including a trial of topical cream or circumcision.  I think his primary issue was just  needing some education about what to expect with sexual activity and afterwards regarding reduction of the foreskin.  Return precautions were discussed extensively  He prefers follow-up with urology as needed, could trial Lotrisone cream if ongoing problems with phimosis or circumcision   Redell Burnet, MD 02/18/2024  [facility] [address], [address] [place], [place] [address] [phone]   "
